# Patient Record
Sex: Female | Born: 1935 | Race: White | Hispanic: No
Health system: Southern US, Community
[De-identification: ages and names within clinical notes are randomized; demographics above are authoritative.]

---

## 2007-06-22 ENCOUNTER — Ambulatory Visit: Payer: Self-pay | Admitting: Internal Medicine

## 2007-07-23 ENCOUNTER — Ambulatory Visit: Payer: Self-pay | Admitting: Internal Medicine

## 2007-07-26 ENCOUNTER — Ambulatory Visit: Payer: Self-pay | Admitting: Internal Medicine

## 2007-08-22 ENCOUNTER — Ambulatory Visit: Payer: Self-pay | Admitting: Internal Medicine

## 2008-01-20 ENCOUNTER — Ambulatory Visit: Payer: Self-pay | Admitting: Oncology

## 2008-01-31 ENCOUNTER — Ambulatory Visit: Payer: Self-pay | Admitting: Oncology

## 2008-02-20 ENCOUNTER — Ambulatory Visit: Payer: Self-pay | Admitting: Oncology

## 2008-05-04 ENCOUNTER — Other Ambulatory Visit: Payer: Self-pay

## 2008-05-04 ENCOUNTER — Inpatient Hospital Stay: Payer: Self-pay | Admitting: Internal Medicine

## 2008-05-19 ENCOUNTER — Ambulatory Visit: Payer: Self-pay | Admitting: Oncology

## 2008-07-22 ENCOUNTER — Ambulatory Visit: Payer: Self-pay | Admitting: Oncology

## 2008-08-07 ENCOUNTER — Ambulatory Visit: Payer: Self-pay | Admitting: Oncology

## 2008-08-21 ENCOUNTER — Ambulatory Visit: Payer: Self-pay | Admitting: Oncology

## 2008-12-04 ENCOUNTER — Ambulatory Visit: Payer: Self-pay | Admitting: Oncology

## 2008-12-27 ENCOUNTER — Emergency Department: Payer: Self-pay | Admitting: Emergency Medicine

## 2009-01-09 ENCOUNTER — Ambulatory Visit: Payer: Self-pay | Admitting: Internal Medicine

## 2009-01-15 ENCOUNTER — Ambulatory Visit: Payer: Self-pay | Admitting: Pain Medicine

## 2009-02-19 ENCOUNTER — Ambulatory Visit: Payer: Self-pay | Admitting: Oncology

## 2009-02-21 ENCOUNTER — Emergency Department: Payer: Self-pay | Admitting: Emergency Medicine

## 2009-03-20 ENCOUNTER — Ambulatory Visit: Payer: Self-pay | Admitting: Oncology

## 2009-05-20 ENCOUNTER — Ambulatory Visit: Payer: Self-pay | Admitting: Oncology

## 2009-07-02 ENCOUNTER — Emergency Department: Payer: Self-pay | Admitting: Emergency Medicine

## 2009-09-21 ENCOUNTER — Ambulatory Visit: Payer: Self-pay | Admitting: Oncology

## 2009-09-24 ENCOUNTER — Ambulatory Visit: Payer: Self-pay | Admitting: Oncology

## 2009-09-29 ENCOUNTER — Ambulatory Visit: Payer: Self-pay | Admitting: Internal Medicine

## 2009-10-17 ENCOUNTER — Inpatient Hospital Stay: Payer: Self-pay | Admitting: Internal Medicine

## 2009-11-04 ENCOUNTER — Emergency Department: Payer: Self-pay | Admitting: Emergency Medicine

## 2009-11-12 ENCOUNTER — Ambulatory Visit: Payer: Self-pay | Admitting: Internal Medicine

## 2010-02-16 ENCOUNTER — Emergency Department: Payer: Self-pay | Admitting: Emergency Medicine

## 2010-02-19 ENCOUNTER — Encounter: Admission: RE | Admit: 2010-02-19 | Discharge: 2010-02-19 | Payer: Self-pay | Admitting: Neurology

## 2010-04-02 ENCOUNTER — Ambulatory Visit: Payer: Self-pay | Admitting: Neurology

## 2010-08-03 ENCOUNTER — Emergency Department: Payer: Self-pay | Admitting: Emergency Medicine

## 2010-08-06 ENCOUNTER — Ambulatory Visit (HOSPITAL_COMMUNITY)
Admission: RE | Admit: 2010-08-06 | Discharge: 2010-08-06 | Payer: Self-pay | Source: Home / Self Care | Admitting: Neurology

## 2010-08-31 ENCOUNTER — Emergency Department: Payer: Self-pay | Admitting: Emergency Medicine

## 2010-09-22 ENCOUNTER — Emergency Department: Payer: Self-pay | Admitting: Emergency Medicine

## 2010-09-24 ENCOUNTER — Ambulatory Visit: Payer: Self-pay | Admitting: Internal Medicine

## 2010-10-14 ENCOUNTER — Ambulatory Visit: Payer: Self-pay | Admitting: Cardiology

## 2010-10-14 ENCOUNTER — Inpatient Hospital Stay (HOSPITAL_COMMUNITY): Admission: EM | Admit: 2010-10-14 | Discharge: 2010-10-16 | Payer: Self-pay | Admitting: Emergency Medicine

## 2010-10-15 ENCOUNTER — Encounter (INDEPENDENT_AMBULATORY_CARE_PROVIDER_SITE_OTHER): Payer: Self-pay | Admitting: Emergency Medicine

## 2010-12-07 ENCOUNTER — Emergency Department: Payer: Self-pay | Admitting: Emergency Medicine

## 2011-01-20 ENCOUNTER — Observation Stay: Payer: Self-pay | Admitting: Internal Medicine

## 2011-02-01 LAB — URINALYSIS, ROUTINE W REFLEX MICROSCOPIC
Bilirubin Urine: NEGATIVE
Glucose, UA: NEGATIVE mg/dL
Hgb urine dipstick: NEGATIVE
Ketones, ur: NEGATIVE mg/dL
Nitrite: POSITIVE — AB
Protein, ur: NEGATIVE mg/dL
Specific Gravity, Urine: 1.007 (ref 1.005–1.030)
Urobilinogen, UA: 0.2 mg/dL (ref 0.0–1.0)
pH: 7 (ref 5.0–8.0)

## 2011-02-01 LAB — BASIC METABOLIC PANEL WITH GFR
BUN: 12 mg/dL (ref 6–23)
CO2: 27 meq/L (ref 19–32)
Calcium: 8.5 mg/dL (ref 8.4–10.5)
Chloride: 105 meq/L (ref 96–112)
Creatinine, Ser: 0.72 mg/dL (ref 0.4–1.2)
GFR calc non Af Amer: >60 mL/min
Glucose, Bld: 103 mg/dL — ABNORMAL HIGH (ref 70–99)
Potassium: 4.5 meq/L (ref 3.5–5.1)
Sodium: 139 meq/L (ref 135–145)

## 2011-02-01 LAB — VALPROIC ACID LEVEL
Valproic Acid Lvl: 33.4 ug/mL — ABNORMAL LOW (ref 50.0–100.0)
Valproic Acid Lvl: 54.8 ug/mL (ref 50.0–100.0)

## 2011-02-01 LAB — CBC
HCT: 35.7 % — ABNORMAL LOW (ref 36.0–46.0)
Hemoglobin: 11.5 g/dL — ABNORMAL LOW (ref 12.0–15.0)
MCH: 31.4 pg (ref 26.0–34.0)
MCHC: 32.2 g/dL (ref 30.0–36.0)
MCV: 97.5 fL (ref 78.0–100.0)
Platelets: 218 10*3/uL (ref 150–400)
RBC: 3.66 MIL/uL — ABNORMAL LOW (ref 3.87–5.11)
RDW: 13.2 % (ref 11.5–15.5)
WBC: 8.7 10*3/uL (ref 4.0–10.5)

## 2011-02-01 LAB — COMPREHENSIVE METABOLIC PANEL WITH GFR
ALT: 13 U/L (ref 0–35)
AST: 17 U/L (ref 0–37)
Albumin: 3.3 g/dL — ABNORMAL LOW (ref 3.5–5.2)
Alkaline Phosphatase: 85 U/L (ref 39–117)
BUN: 11 mg/dL (ref 6–23)
CO2: 28 meq/L (ref 19–32)
Calcium: 8.4 mg/dL (ref 8.4–10.5)
Chloride: 103 meq/L (ref 96–112)
Creatinine, Ser: 0.75 mg/dL (ref 0.4–1.2)
GFR calc non Af Amer: >60 mL/min
Glucose, Bld: 93 mg/dL (ref 70–99)
Potassium: 3.8 meq/L (ref 3.5–5.1)
Sodium: 138 meq/L (ref 135–145)
Total Bilirubin: 0.3 mg/dL (ref 0.3–1.2)
Total Protein: 5.9 g/dL — ABNORMAL LOW (ref 6.0–8.3)

## 2011-02-01 LAB — TSH: TSH: 0.028 u[IU]/mL — ABNORMAL LOW (ref 0.350–4.500)

## 2011-02-01 LAB — URINE MICROSCOPIC-ADD ON

## 2011-02-01 LAB — BASIC METABOLIC PANEL
CO2: 29 mEq/L (ref 19–32)
Chloride: 103 mEq/L (ref 96–112)
GFR calc Af Amer: >60 mL/min (ref >60)
Sodium: 136 mEq/L (ref 135–145)

## 2011-02-01 LAB — APTT: aPTT: 26 s (ref 24–37)

## 2011-02-01 LAB — LIPID PANEL
HDL: 45 mg/dL
Total CHOL/HDL Ratio: 5.7 ratio
Triglycerides: 119 mg/dL
VLDL: 24 mg/dL (ref 0–40)

## 2011-02-01 LAB — URINE CULTURE
Colony Count: >=100000
Culture  Setup Time: 201111241853

## 2011-02-01 LAB — ACETYLCHOLINE RECEPTOR, BINDING: Acetylcholine Receptor Ab: <0.3 nmol/L (ref <=0.30)

## 2011-02-01 LAB — CK TOTAL AND CKMB (NOT AT ARMC)
CK, MB: 1.4 ng/mL (ref 0.3–4.0)
Relative Index: INVALID (ref 0.0–2.5)
Total CK: 32 U/L (ref 7–177)

## 2011-02-01 LAB — PROTIME-INR
INR: 0.98 (ref 0.00–1.49)
Prothrombin Time: 13.2 s (ref 11.6–15.2)

## 2011-02-01 LAB — AMMONIA: Ammonia: 20 umol/L (ref 11–35)

## 2011-02-01 LAB — TROPONIN I

## 2011-02-03 LAB — GLUCOSE, CAPILLARY: Glucose-Capillary: 97 mg/dL (ref 70–99)

## 2011-02-05 ENCOUNTER — Emergency Department: Payer: Self-pay | Admitting: Emergency Medicine

## 2011-04-13 ENCOUNTER — Emergency Department: Payer: Self-pay | Admitting: Unknown Physician Specialty

## 2011-05-07 ENCOUNTER — Inpatient Hospital Stay: Payer: Self-pay | Admitting: Internal Medicine

## 2011-05-22 ENCOUNTER — Ambulatory Visit: Payer: Self-pay | Admitting: Internal Medicine

## 2011-06-02 ENCOUNTER — Emergency Department: Payer: Self-pay | Admitting: Unknown Physician Specialty

## 2011-06-10 ENCOUNTER — Inpatient Hospital Stay: Payer: Self-pay | Admitting: Internal Medicine

## 2011-06-18 ENCOUNTER — Encounter: Payer: Self-pay | Admitting: Internal Medicine

## 2011-06-22 ENCOUNTER — Encounter: Payer: Self-pay | Admitting: Internal Medicine

## 2011-06-28 ENCOUNTER — Ambulatory Visit: Payer: Self-pay | Admitting: Internal Medicine

## 2011-07-27 ENCOUNTER — Encounter: Payer: Self-pay | Admitting: Internal Medicine

## 2011-07-29 ENCOUNTER — Emergency Department: Payer: Self-pay | Admitting: *Deleted

## 2011-08-08 ENCOUNTER — Inpatient Hospital Stay: Payer: Self-pay | Admitting: Internal Medicine

## 2011-08-18 ENCOUNTER — Encounter: Payer: Self-pay | Admitting: Internal Medicine

## 2011-08-22 ENCOUNTER — Encounter: Payer: Self-pay | Admitting: Internal Medicine

## 2011-09-22 ENCOUNTER — Encounter: Payer: Self-pay | Admitting: Internal Medicine

## 2011-10-22 ENCOUNTER — Encounter: Payer: Self-pay | Admitting: Internal Medicine

## 2011-11-22 ENCOUNTER — Encounter: Payer: Self-pay | Admitting: Internal Medicine

## 2011-11-25 IMAGING — US ABDOMEN ULTRASOUND
1 series · 17 of 25 positions shown · non-contrast
Comparison: none

REASON FOR EXAM: fever, elevated lft's
COMMENTS:

PROCEDURE:     US  - US ABDOMEN GENERAL SURVEY  - November 04, 2009  [DATE]
RESULT:     Comparison: None
TECHNIQUE: Multiple gray-scale and color-flow Doppler images of the abdomen
are presented for review.

[Series 1: abdomen ultrasound · 17 of 81 slices shown]
[im 1/81]
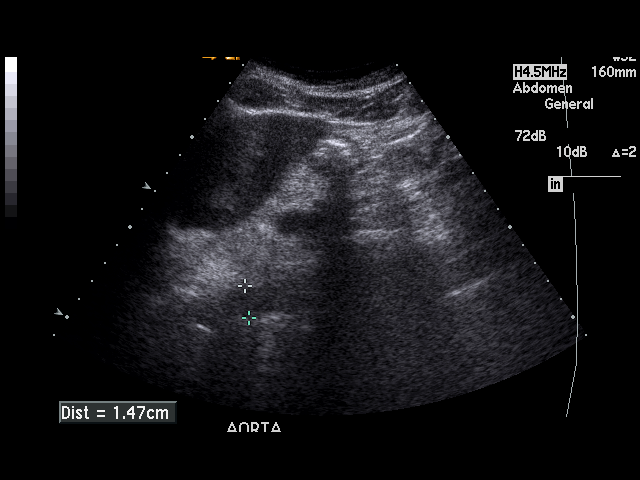
[im 7/81]
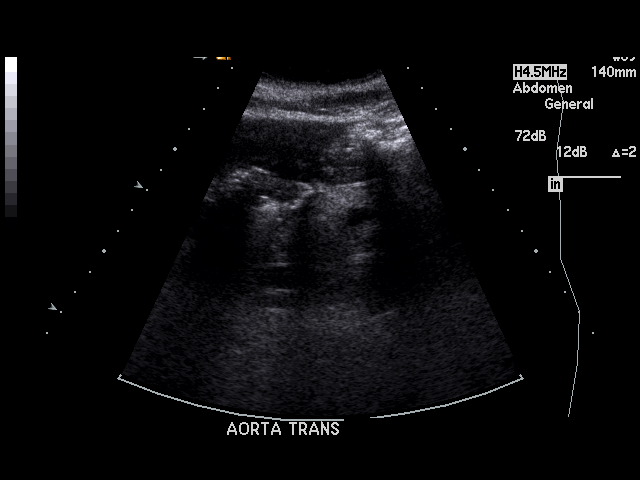
[im 11/81]
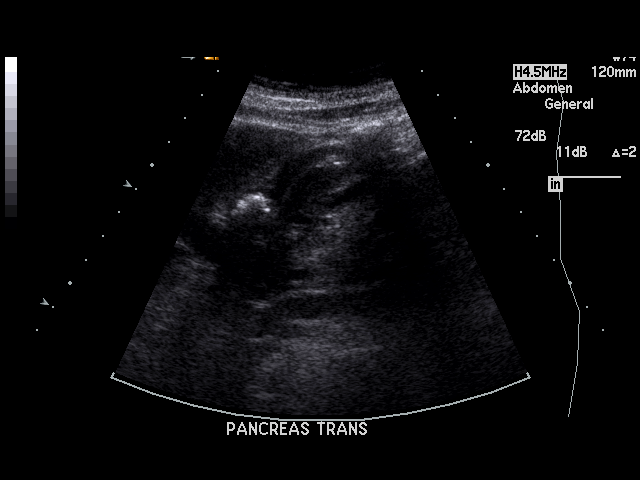
[im 17/81]
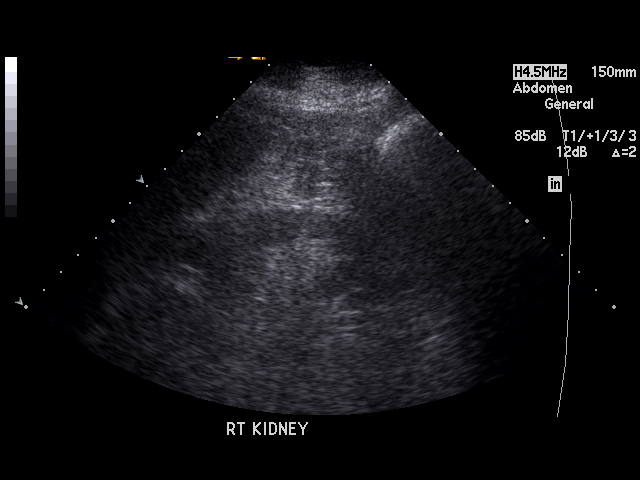
[im 21/81]
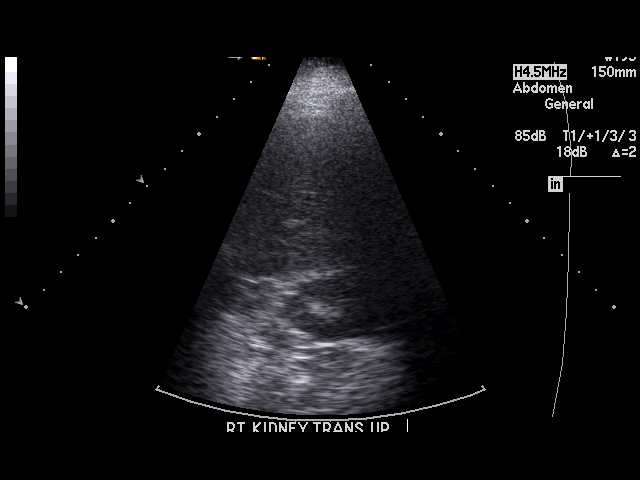
[im 27/81]
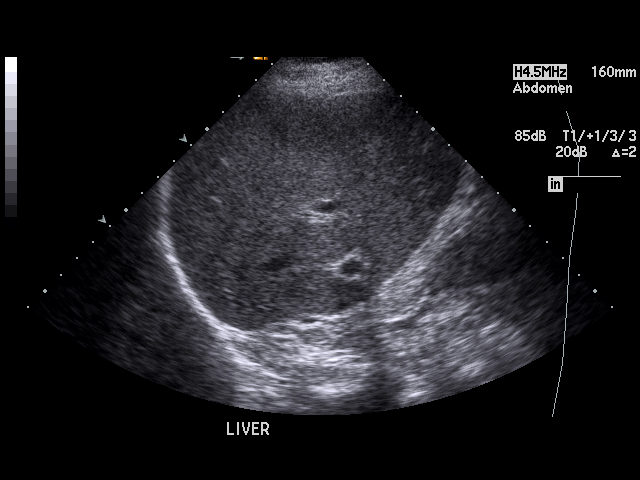
[im 31/81]
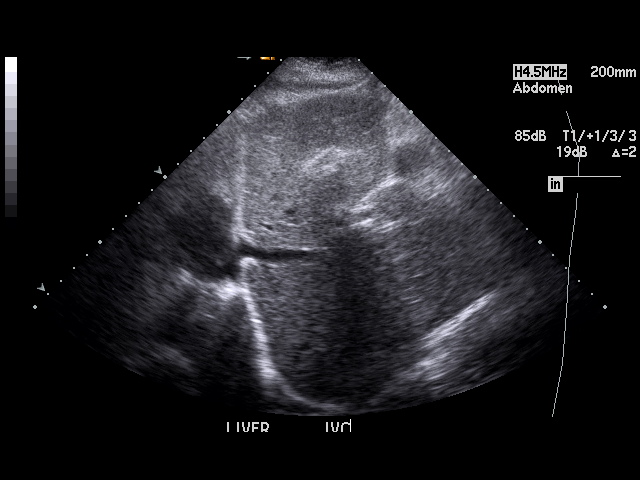
[im 37/81]
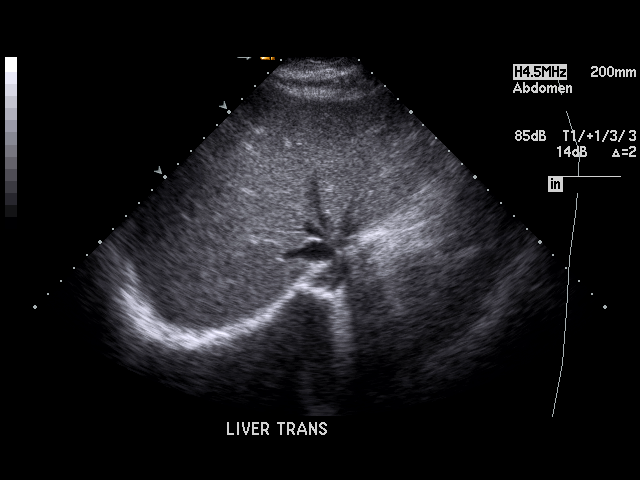
[im 41/81]
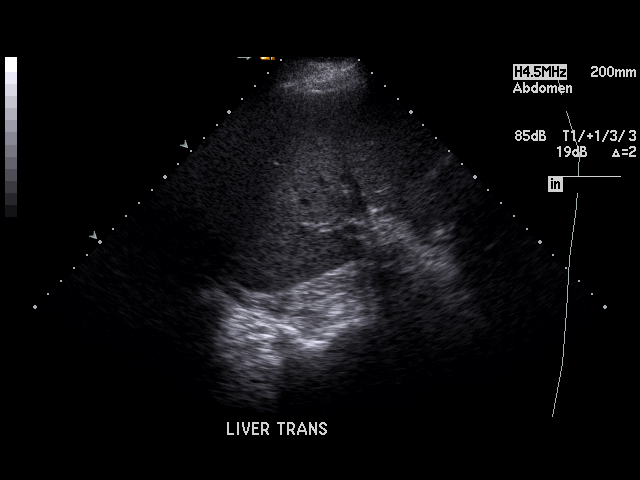
[im 44/81]
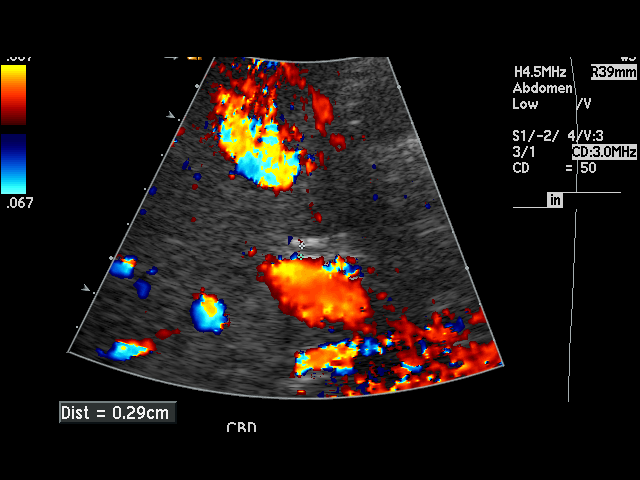
[im 51/81]
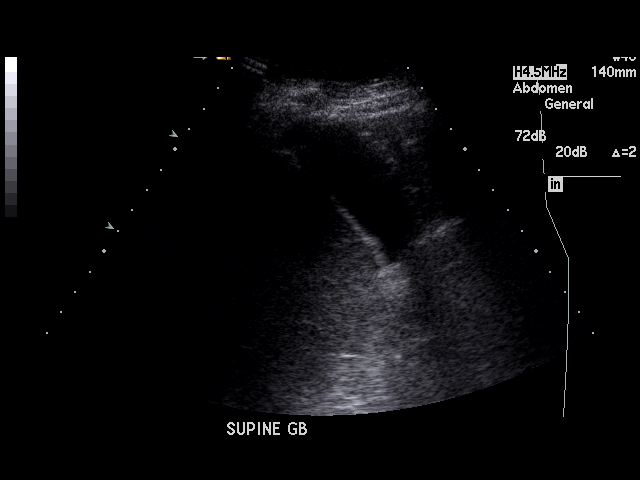
[im 54/81]
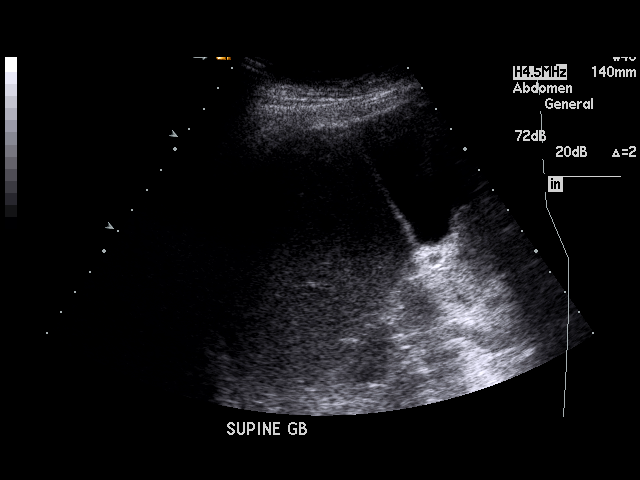
[im 61/81]
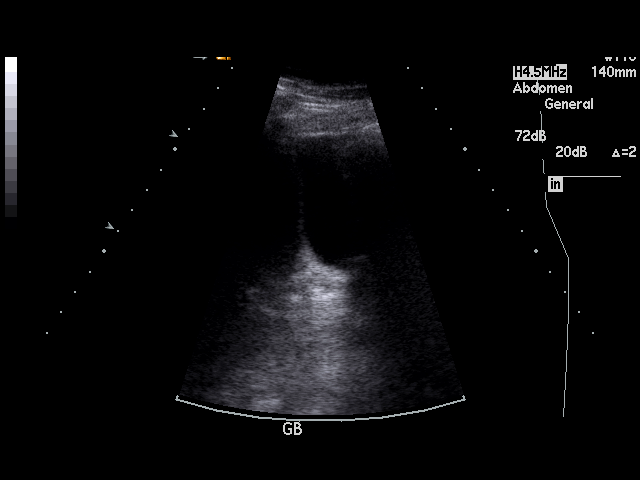
[im 64/81]
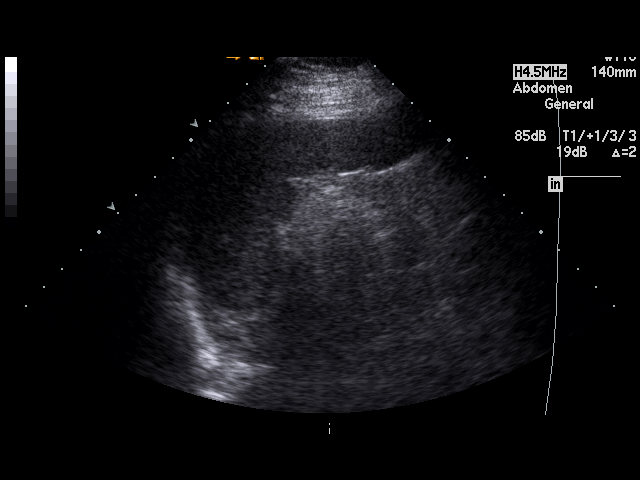
[im 71/81]
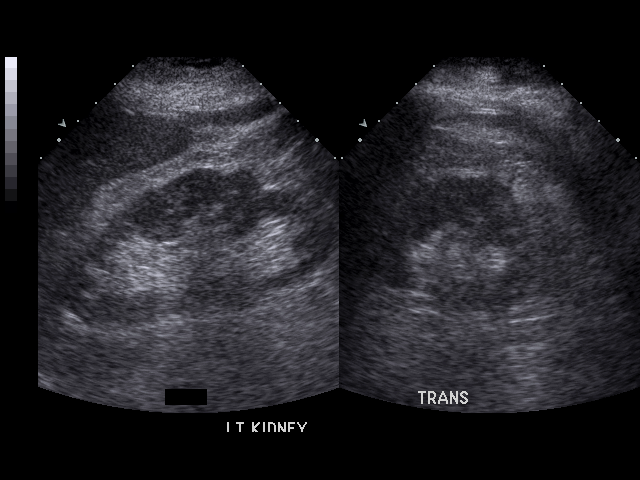
[im 74/81]
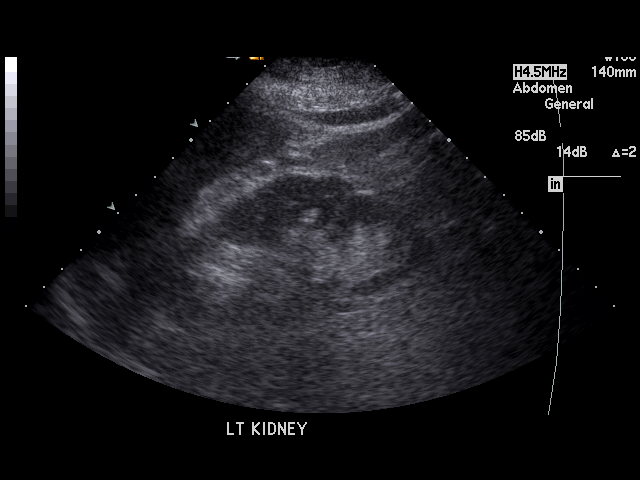
[im 81/81]
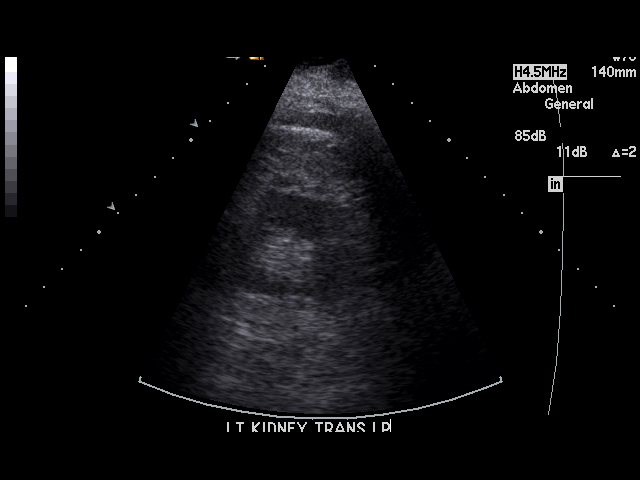

[17 of 25 positions shown; findings below may reference images not displayed]

FINDINGS: Visualized portions of the liver demonstrate normal echogenicity and normal
contours. The liver is without evidence of a focal hepatic lesion.

There is no cholelithiasis or biliary sludge. There is no intra- or
extrahepatic biliary ductal dilatation. The common duct measures 2.9 mm in
maximal diameter. There is no gallbladder wall thickening, pericholecystic
fluid, or sonographic Murphy's sign.

The pancreatic head and proximal body demonstrate no focal abnormality. The
remainder the pancreas is suboptimally visualized secondary to overlying
bowel gas. The spleen is unremarkable. Bilateral kidneys are normal in
echogenicity and size. The right kidney measures 9.5 cm. The left kidney
measures 11 cm. There are no renal calculi or hydronephrosis. The IVC is
unremarkable. There is a small infrarenal abdominal aortic aneurysm
measuring 3 x 2.9 cm.
IMPRESSION: No cholelithiasis or sonographic evidence of acute cholecystitis.

Small abdominal aortic aneurysm measuring 3 x 2.9 cm.

## 2011-12-21 LAB — URINALYSIS, COMPLETE
Bilirubin,UR: NEGATIVE
Blood: NEGATIVE
Ketone: NEGATIVE
Leukocyte Esterase: NEGATIVE
Nitrite: NEGATIVE
Ph: 6 (ref 4.5–8.0)
Protein: NEGATIVE
RBC,UR: 2 /HPF (ref 0–5)
Squamous Epithelial: 1

## 2011-12-23 ENCOUNTER — Encounter: Payer: Self-pay | Admitting: Internal Medicine

## 2012-01-12 LAB — BASIC METABOLIC PANEL
Anion Gap: 7 (ref 7–16)
Calcium, Total: 9 mg/dL (ref 8.5–10.1)
Chloride: 99 mmol/L (ref 98–107)
Co2: 29 mmol/L (ref 21–32)
Creatinine: 0.85 mg/dL (ref 0.60–1.30)
EGFR (African American): >60
Potassium: 4.2 mmol/L (ref 3.5–5.1)

## 2012-01-24 ENCOUNTER — Ambulatory Visit: Payer: Self-pay | Admitting: Ophthalmology

## 2012-01-31 ENCOUNTER — Ambulatory Visit: Payer: Self-pay | Admitting: Ophthalmology

## 2012-02-02 ENCOUNTER — Encounter: Payer: Self-pay | Admitting: Internal Medicine

## 2012-02-02 LAB — BASIC METABOLIC PANEL
Anion Gap: 12 (ref 7–16)
BUN: 15 mg/dL (ref 7–18)
Calcium, Total: 8.4 mg/dL — ABNORMAL LOW (ref 8.5–10.1)
Chloride: 102 mmol/L (ref 98–107)
Co2: 28 mmol/L (ref 21–32)
EGFR (African American): >60
Glucose: 89 mg/dL (ref 65–99)
Osmolality: 283 (ref 275–301)
Sodium: 142 mmol/L (ref 136–145)

## 2012-02-20 ENCOUNTER — Encounter: Payer: Self-pay | Admitting: Internal Medicine

## 2012-02-23 LAB — TSH: Thyroid Stimulating Horm: 1.12 u[IU]/mL

## 2012-02-23 LAB — CBC WITH DIFFERENTIAL/PLATELET
Eosinophil #: 0.1 10*3/uL (ref 0.0–0.7)
HCT: 37.5 % (ref 35.0–47.0)
HGB: 12.2 g/dL (ref 12.0–16.0)
Lymphocyte #: 3.3 10*3/uL (ref 1.0–3.6)
Lymphocyte %: 30.6 %
MCH: 32.6 pg (ref 26.0–34.0)
MCHC: 32.6 g/dL (ref 32.0–36.0)
MCV: 100 fL (ref 80–100)
Monocyte %: 7.2 %
Neutrophil #: 6.6 10*3/uL — ABNORMAL HIGH (ref 1.4–6.5)

## 2012-03-21 ENCOUNTER — Encounter: Payer: Self-pay | Admitting: Internal Medicine

## 2012-04-21 DEATH — deceased

## 2012-09-21 IMAGING — CT CT HEAD WITHOUT CONTRAST
2 of 4 series · 16 of 30 positions shown, 19 images · non-contrast
Comparison: none

REASON FOR EXAM: pain after fall
COMMENTS:   May transport without cardiac monitor

PROCEDURE:     CT  - CT HEAD WITHOUT CONTRAST  - September 01, 2010  [DATE]
RESULT:
TECHNIQUE: Noncontrast emergent CT of the head is performed in the standard
fashion.
There is no previous exam for comparison.

[Series 2: without · axial · non-contrast · 0.43mm/px · z∈[-196,-76]mm · 9 of 32 slices shown, 12 images]
[im 4/32  brain]
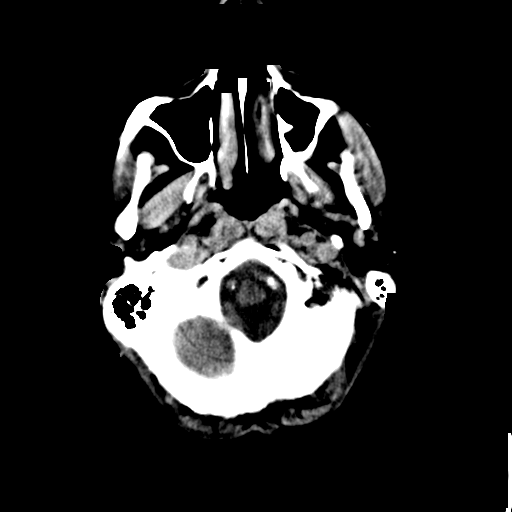
[im 4/32  bone]
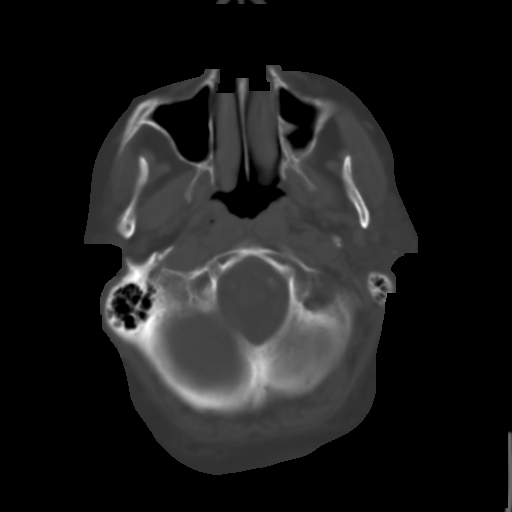
[im 7/32  brain]
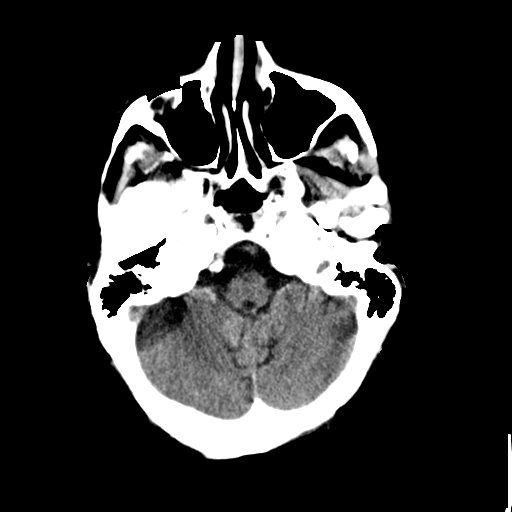
[im 10/32  brain]
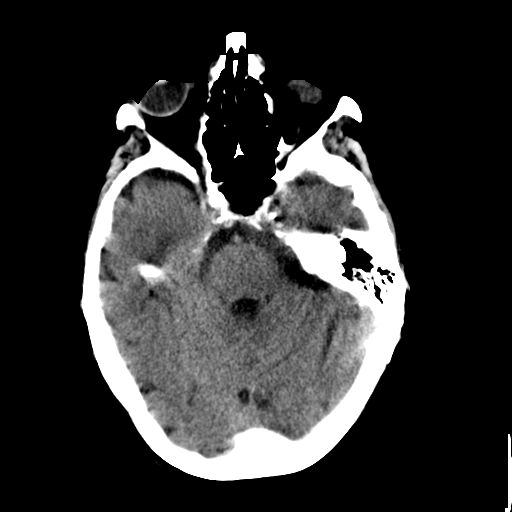
[im 13/32  brain]
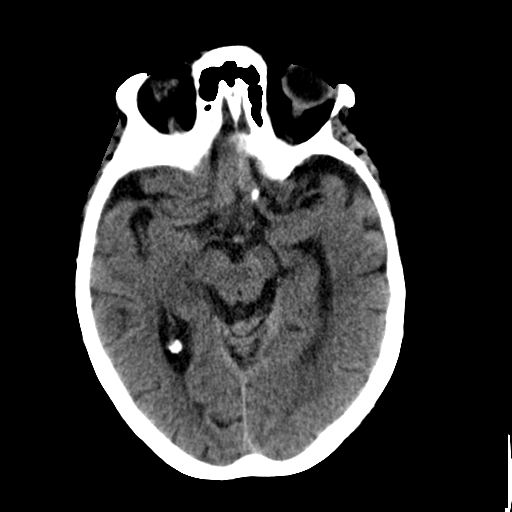
[im 16/32  brain]
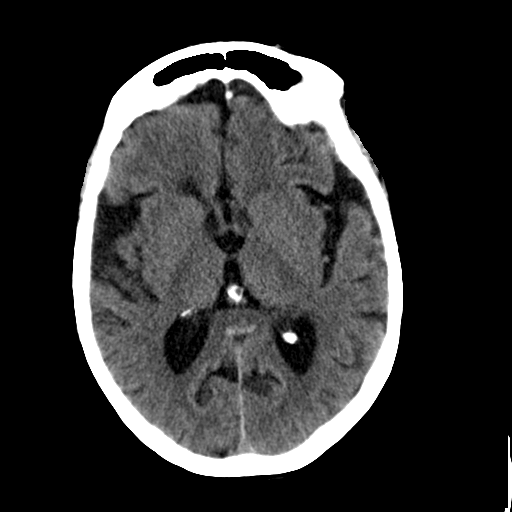
[im 16/32  bone]
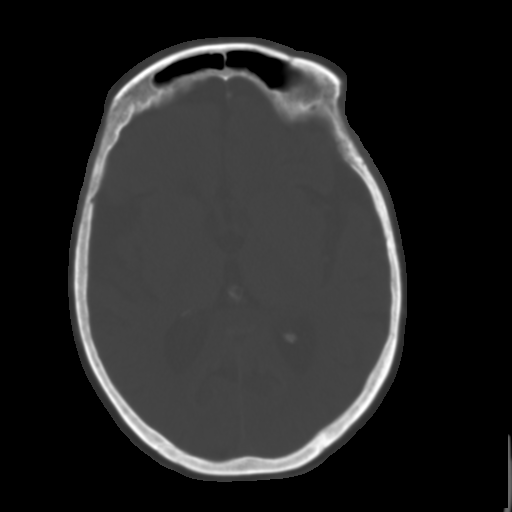
[im 19/32  brain]
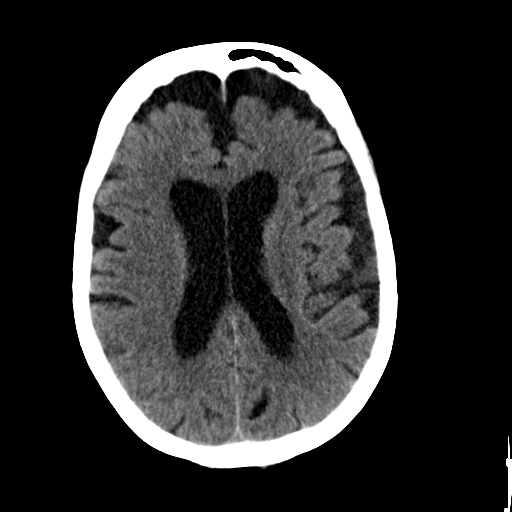
[im 22/32  brain]
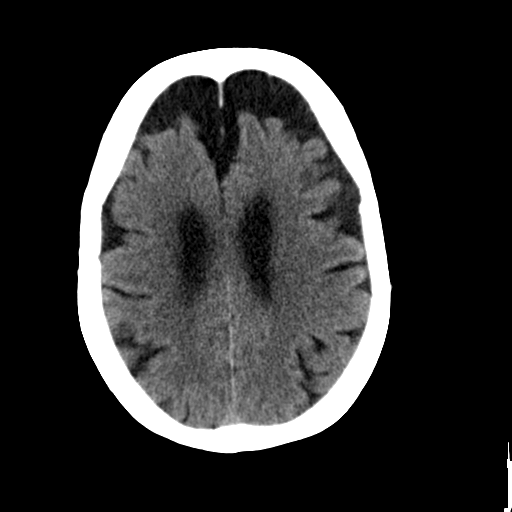
[im 25/32  brain]
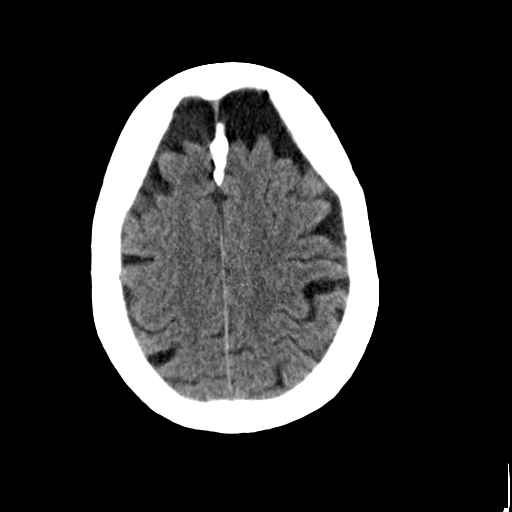
[im 28/32  brain]
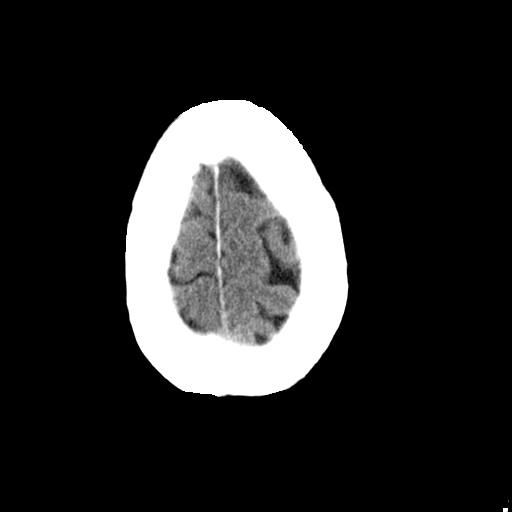
[im 28/32  bone]
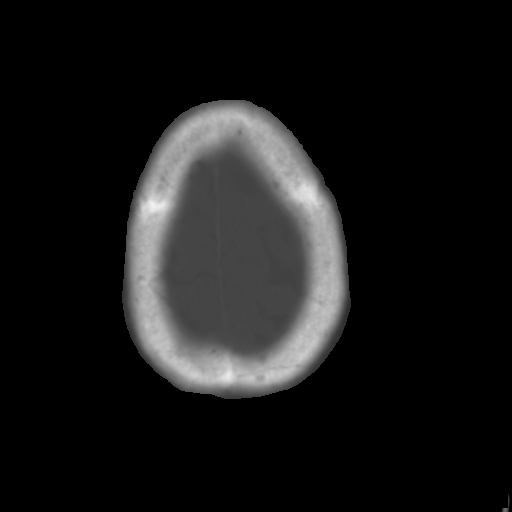

[Series 3: bone · axial · 0.43mm/px · z∈[-196,-91]mm · 7 of 32 slices shown]
[im 4/32  bone]
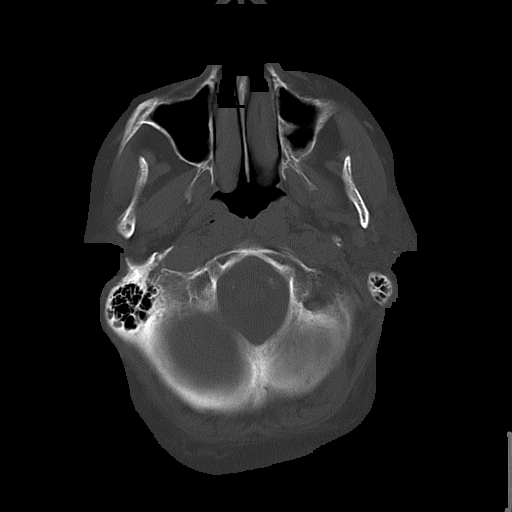
[im 7/32  bone]
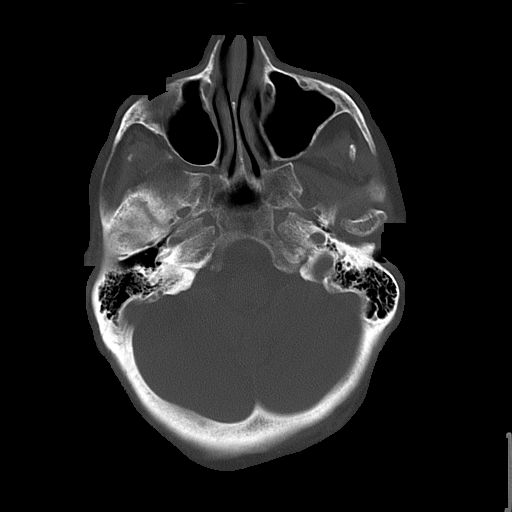
[im 10/32  bone]
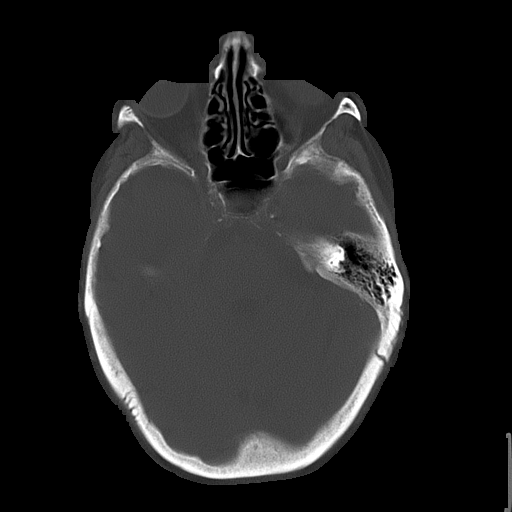
[im 13/32  bone]
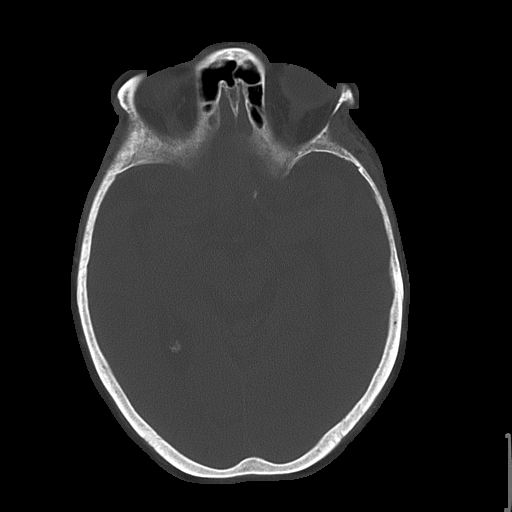
[im 19/32  bone]
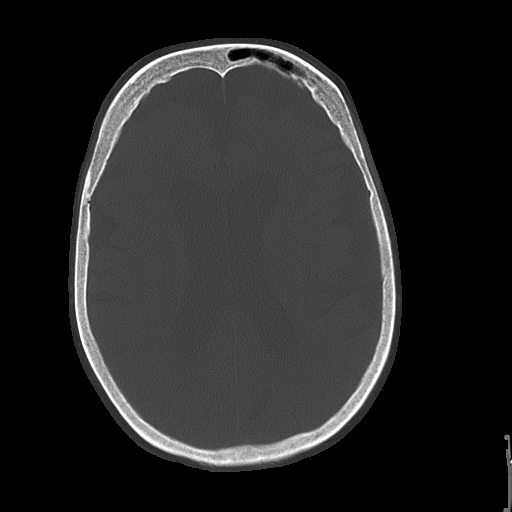
[im 22/32  bone]
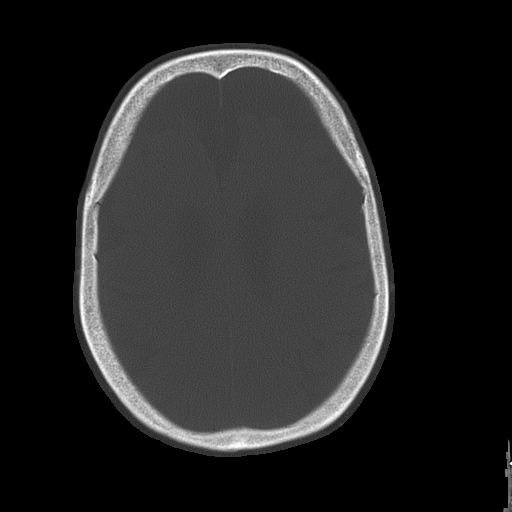
[im 25/32  bone]
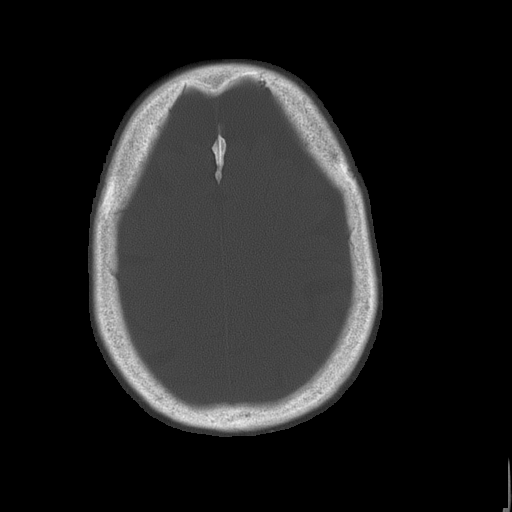

[16 of 30 positions shown; findings below may reference images not displayed]

FINDINGS: There is prominence of the ventricles and sulci consistent with
atrophy. There is no intracranial hemorrhage, mass or mass effect. There is
no midline shift. There is some low attenuation in the right frontal region
on image 21 which could represent a small evolving infarct. There is diffuse
low attenuation within the periventricular and subcortical white matter most
consistent with chronic small vessel ischemic disease. The included
paranasal sinuses and mastoid air cells demonstrate grossly normal aeration.
The calvarium is intact.
IMPRESSION: 1.  Changes of atrophy with chronic small vessel ischemic disease.
2.  A small evolving infarct in the right frontal region versus volume
averaging is suggested on image 21. MRI follow-up may be beneficial.

## 2012-09-21 IMAGING — CR DG LUMBAR SPINE 2-3V
1 series · 3 of 3 positions shown · non-contrast
Comparison: none

REASON FOR EXAM: pain after fall
COMMENTS:   May transport without cardiac monitor

[Series 1: view not recorded · 0.17mm/px · 3 of 3 slices shown]
[im 1/3]
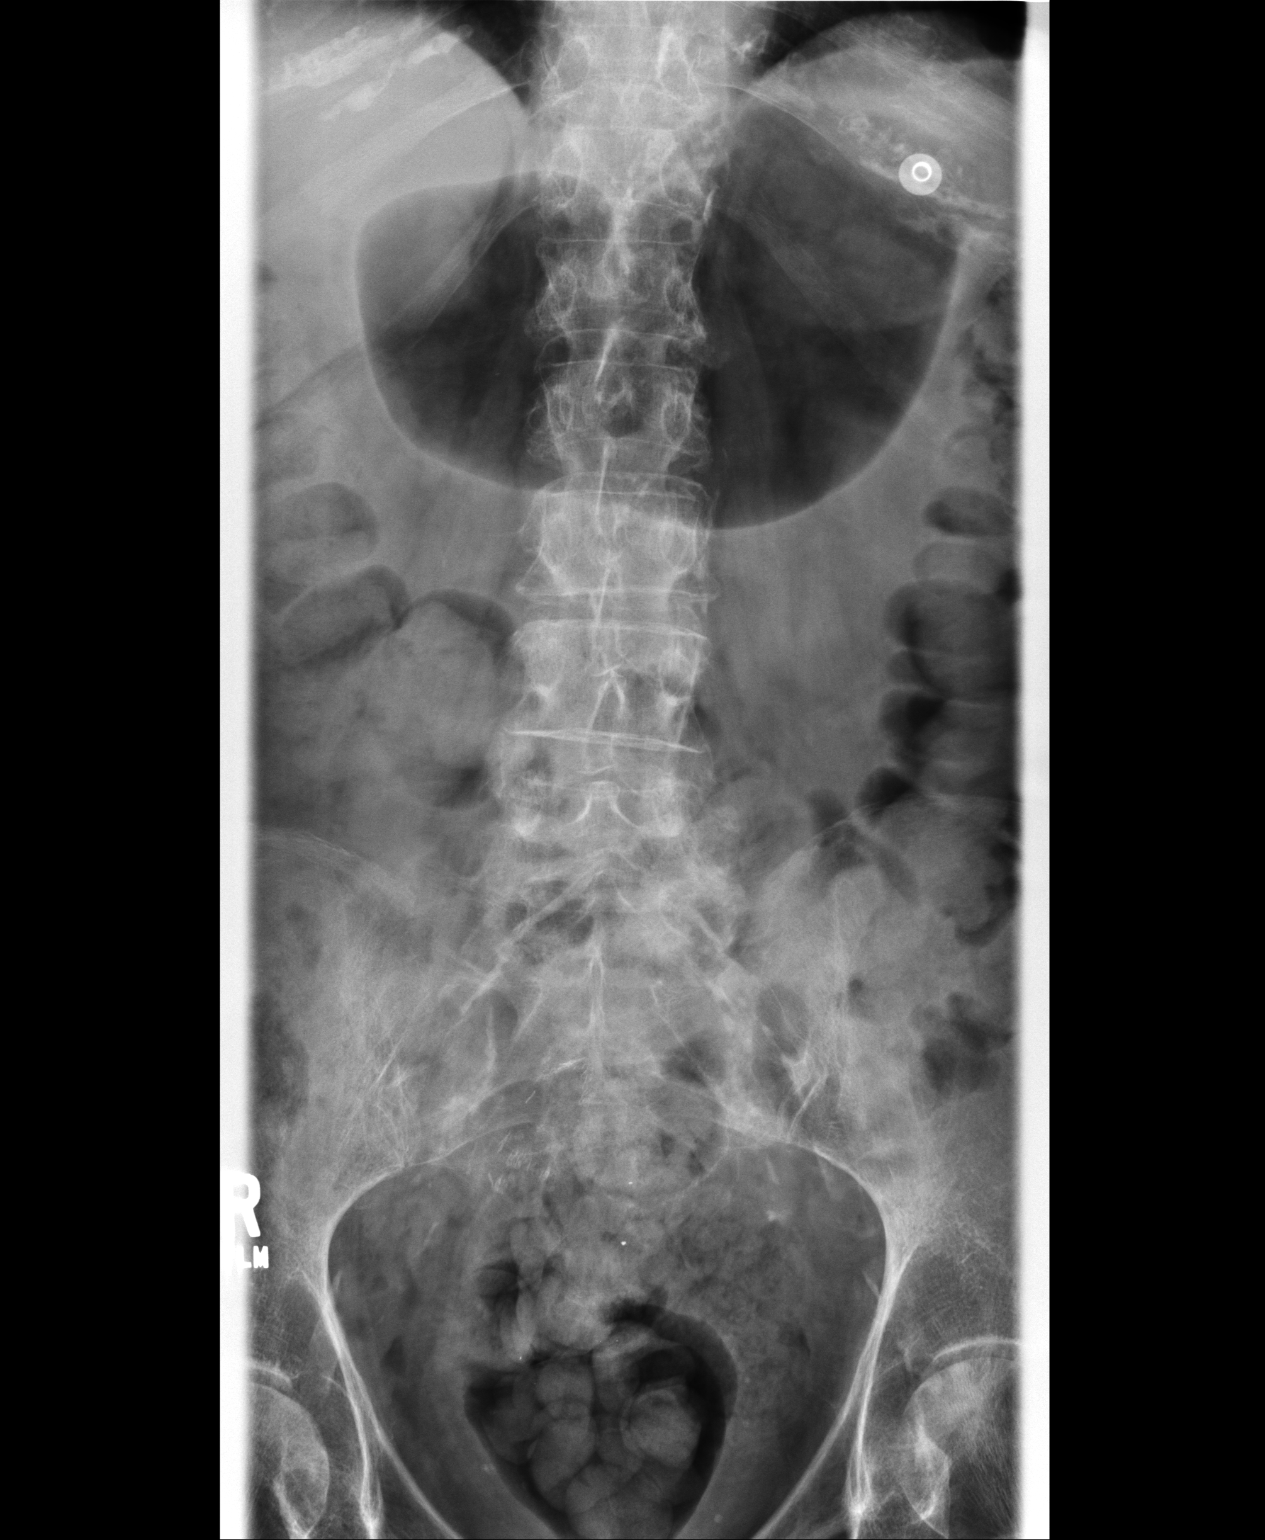
[im 2/3]
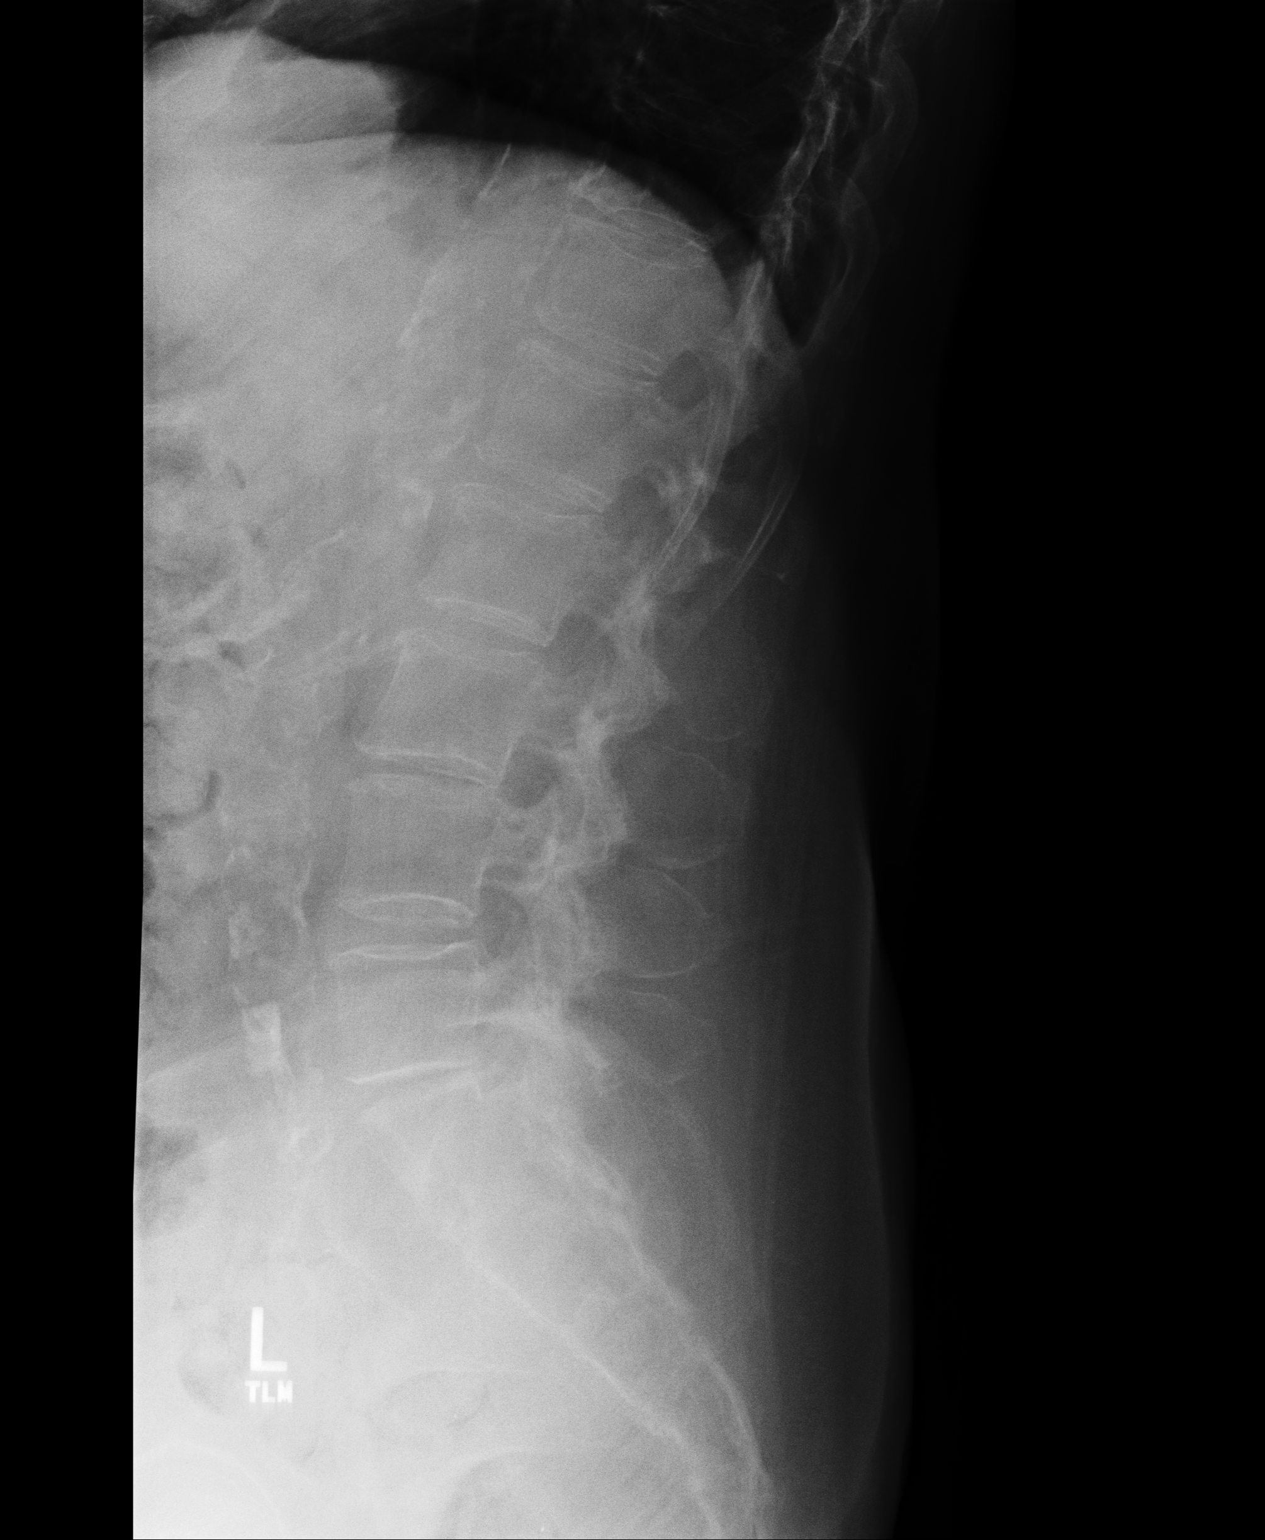
[im 3/3]
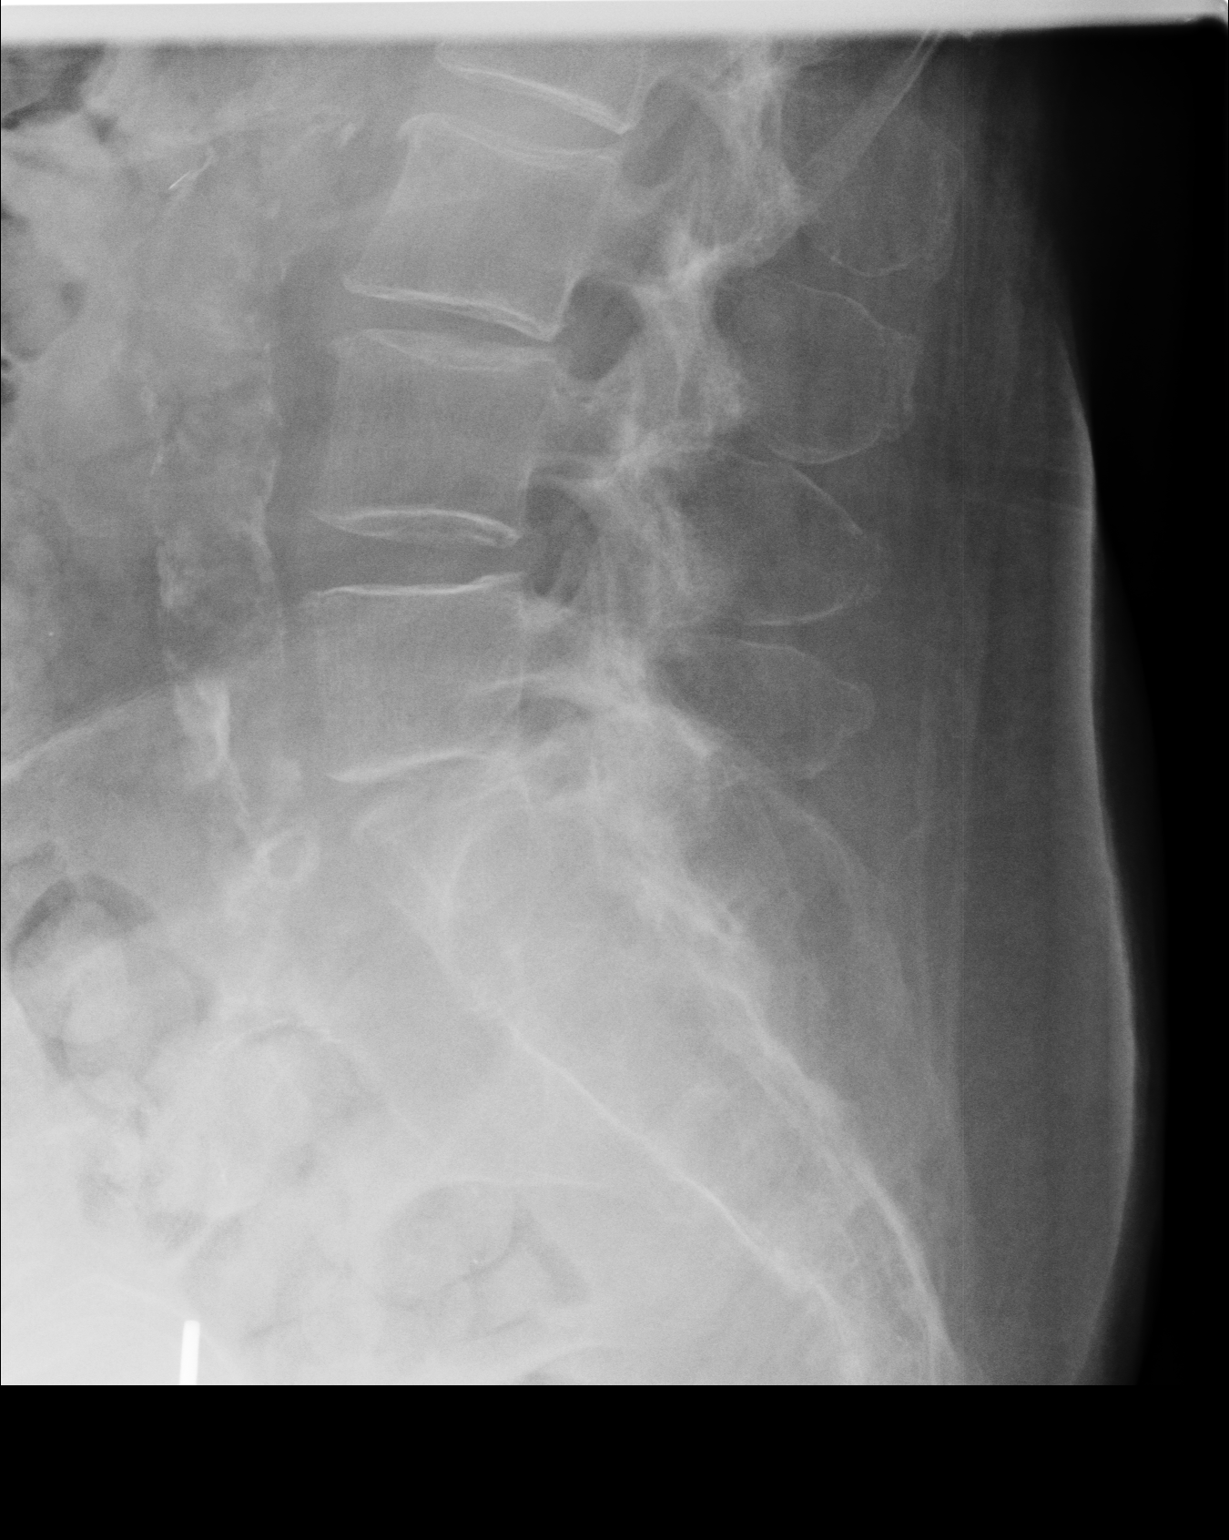

[3 of 3 positions shown; findings below may reference images not displayed]

PROCEDURE:     DXR - DXR LUMBAR SPINE AP AND LATERAL  - September 01, 2010  [DATE]

RESULT:     Images of the lumbar spine demonstrate preservation of the
vertebral body heights with multilevel mild disc space narrowing at L5-S1
and L3-4 with facet hypertrophy. There is prominent atherosclerotic
calcification. The pedicles appear to be intact. The sacroiliac joints
appear to be unremarkable.
IMPRESSION: Degenerative changes in the lumbar spine. No acute bony
abnormality evident.

## 2015-03-15 NOTE — Op Note (Signed)
PATIENT NAME:  Tina NajjarSWASEY, [name] MR#:  [id number] DATE OF BIRTH:  [birth year]  DATE OF PROCEDURE:  01/31/2012  PREOPERATIVE DIAGNOSIS: Visually significant cataract of the left eye.   POSTOPERATIVE DIAGNOSIS: Visually significant cataract of the left eye.   OPERATIVE PROCEDURE: Cataract extraction by phacoemulsification with implant of intraocular lens to left eye.   SURGEON: Galen ManilaWilliam Glynn Freas, MD.   ANESTHESIA:  1. Managed anesthesia care.  2. Topical tetracaine drops followed by 2% Xylocaine jelly applied in the preoperative holding area.   COMPLICATIONS: None.   TECHNIQUE:  Stop and chop.   DESCRIPTION OF PROCEDURE: The patient was examined and consented in the preoperative holding area where the aforementioned topical anesthesia was applied to the left eye and then brought back to the Operating Room where the left eye was prepped and draped in the usual sterile ophthalmic fashion and a lid speculum was placed. A paracentesis was created with the side port blade and the anterior chamber was filled with viscoelastic. A near clear corneal incision was performed with the steel keratome. A continuous curvilinear capsulorrhexis was performed with a cystotome followed by the capsulorrhexis forceps. Hydrodissection and hydrodelineation were carried out with BSS on a blunt cannula. The lens was removed in a stop and chop technique and the remaining cortical material was removed with the irrigation-aspiration handpiece. The capsular bag was inflated with viscoelastic and the Technis ZCB00 23.5-diopter lens, serial number [id number] was placed in the capsular bag without complication. The remaining viscoelastic was removed from the eye with the irrigation-aspiration handpiece. The wounds were hydrated. The anterior chamber was flushed with Miostat and the eye was inflated to physiologic pressure. The wounds were found to be water tight. The eye was dressed with Vigamox. The patient was given protective  glasses to wear throughout the day and a shield with which to sleep tonight. The patient was also given drops with which to begin a drop regimen today and will follow-up with me in one day.   ____________________________ Jerilee FieldWilliam L. Shiara Mcgough, MD wlp:cms D: 01/31/2012 13:11:40 ET T: 01/31/2012 13:31:21 ET JOB#: [id number]  cc: Iden Stripling L. Shaquoia Miers, MD, <Dictator> Jerilee FieldWILLIAM L Judene Logue MD ELECTRONICALLY SIGNED 02/01/2012 13:42
